# Patient Record
Sex: Female | Born: 2005 | Race: Black or African American | Hispanic: No | Marital: Single | Smoking: Never smoker
Health system: Southern US, Community
[De-identification: ages and names within clinical notes are randomized; demographics above are authoritative.]

## PROBLEM LIST (undated history)

## (undated) DIAGNOSIS — F419 Anxiety disorder, unspecified: Secondary | ICD-10-CM

## (undated) DIAGNOSIS — F329 Major depressive disorder, single episode, unspecified: Secondary | ICD-10-CM

## (undated) DIAGNOSIS — F32A Depression, unspecified: Secondary | ICD-10-CM

## (undated) DIAGNOSIS — R011 Cardiac murmur, unspecified: Secondary | ICD-10-CM

## (undated) HISTORY — DX: Cardiac murmur, unspecified: R01.1

---
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
## 2024-03-19 NOTE — Progress Notes (Incomplete)
 "  Acute Office Visit  Subjective:  Patient ID: Maureen Le, female    DOB: [birth year]  Age: 19 y.o. MRN: [id number]  CC: No chief complaint on file.     HPI Anitha Kreiser is here for emergency department follow up. She was seen in the emergency department on January 8th and 13th, initially for an upper respiratory infection and chest wall pain and then on 03/05/2024 for chest pain exacerbated with breathing. Her vitals and labs were reassuring and she reported improvement after Robaxin.        Past Medical History:  Diagnosis Date   Anxiety    Depression    Heart murmur    childhood    No past surgical history on file.  No family history on file.  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    Social Drivers of Health   Tobacco Use: Low Risk (11/13/2023)   Patient History    Smoking Tobacco Use: Never    Smokeless Tobacco Use: Never    Passive Exposure: Not on file  Financial Resource Strain: Medium Risk (11/12/2023)   Overall Financial Resource Strain (CARDIA)    Difficulty of Paying Living Expenses: Somewhat hard  Food Insecurity: Food Insecurity Present (11/12/2023)    Epic    Worried About Programme Researcher, Broadcasting/film/video in the Last Year: Sometimes true    Ran Out of Food in the Last Year: Sometimes true  Transportation Needs: No Transportation Needs (11/12/2023)   Epic    Lack of Transportation (Medical): No    Lack of Transportation (Non-Medical): No  Physical Activity: Insufficiently Active (11/12/2023)   Exercise Vital Sign    Days of Exercise per Week: 3 days    Minutes of Exercise per Session: 30 min  Stress: Stress Concern Present (11/12/2023)   [employer] of Occupational [employer] - Occupational Stress Questionnaire    Feeling of Stress: Rather much  Social Connections: Socially Isolated (11/12/2023)   Social Connection and Isolation Panel    Frequency of Communication with Friends and Family: Once a week    Frequency of Social Gatherings with Friends and Family: Once a week    Attends Religious Services: Never    Database Administrator or Organizations: No    Attends Banker Meetings: Not on file    Marital Status: Living with partner  Intimate Partner Violence: Not At Risk (03/05/2024)   Received from [facility]   HITS    Over the last 12 months how often did your partner scream or curse at you?: Never  Over the last 12 months how often did your partner physically hurt you?: Never    Over the last 12 months how often did your partner insult you or talk down to you?: Never    Over the last 12 months how often did your partner threaten you with physical harm?: Never  Depression (PHQ2-9): High Risk (11/13/2023)   Depression (PHQ2-9)    PHQ-2 Score: 25  Alcohol Screen: Low Risk (04/18/2023)   Alcohol Screen    Last Alcohol Screening Score (AUDIT): 1  Housing: High Risk (11/12/2023)   Epic    Unable to Pay for Housing in the Last Year: No    Number of Times Moved in the Last Year: 3    Homeless in the Last Year: Yes  Utilities: Not on file  Health Literacy: Not on file    ROS All ROS negative except what is listed in the HPI.    Objective:   Today's Vitals: There were no vitals taken for this visit.  Physical Exam  Assessment & Plan:   Problem List Items Addressed This Visit   None     Follow-up: No follow-ups on file.   Waddell FURY Almarie, DNP, FNP-C  I,Emily Lagle,acting as a neurosurgeon for Waddell KATHEE Almarie, NP.,have documented all relevant documentation on the behalf of Waddell KATHEE Almarie, NP.   I, Waddell KATHEE Almarie, NP, have reviewed all documentation for this visit. The documentation on 03/20/2024 for the exam, diagnosis, procedures, and orders are all accurate and complete.  "

## 2024-03-20 ENCOUNTER — Ambulatory Visit: Admitting: Family Medicine
